# Patient Record
Sex: Male | Born: 2000 | Race: Black or African American | Hispanic: No | Marital: Married | Smoking: Never smoker
Health system: Southern US, Community
[De-identification: ages and names within clinical notes are randomized; demographics above are authoritative.]

## PROBLEM LIST (undated history)

## (undated) DIAGNOSIS — J45909 Unspecified asthma, uncomplicated: Secondary | ICD-10-CM

## (undated) HISTORY — PX: SURGERY SCROTAL / TESTICULAR: SUR1316

---
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
## 2021-04-27 ENCOUNTER — Encounter (HOSPITAL_COMMUNITY): Payer: Self-pay | Admitting: Emergency Medicine

## 2021-04-27 ENCOUNTER — Emergency Department (HOSPITAL_COMMUNITY)
Admission: EM | Admit: 2021-04-27 | Discharge: 2021-04-27 | Disposition: A | Discharge status: Home / Self Care | Attending: Emergency Medicine | Admitting: Emergency Medicine

## 2021-04-27 ENCOUNTER — Other Ambulatory Visit: Payer: Self-pay

## 2021-04-27 ENCOUNTER — Emergency Department (HOSPITAL_COMMUNITY)

## 2021-04-27 DIAGNOSIS — J029 Acute pharyngitis, unspecified: Secondary | ICD-10-CM | POA: Insufficient documentation

## 2021-04-27 DIAGNOSIS — R112 Nausea with vomiting, unspecified: Secondary | ICD-10-CM | POA: Insufficient documentation

## 2021-04-27 DIAGNOSIS — Z20822 Contact with and (suspected) exposure to covid-19: Secondary | ICD-10-CM | POA: Insufficient documentation

## 2021-04-27 DIAGNOSIS — K921 Melena: Secondary | ICD-10-CM | POA: Diagnosis not present

## 2021-04-27 DIAGNOSIS — J45909 Unspecified asthma, uncomplicated: Secondary | ICD-10-CM | POA: Diagnosis not present

## 2021-04-27 DIAGNOSIS — R109 Unspecified abdominal pain: Secondary | ICD-10-CM | POA: Diagnosis not present

## 2021-04-27 DIAGNOSIS — R000 Tachycardia, unspecified: Secondary | ICD-10-CM | POA: Diagnosis not present

## 2021-04-27 DIAGNOSIS — J111 Influenza due to unidentified influenza virus with other respiratory manifestations: Secondary | ICD-10-CM

## 2021-04-27 DIAGNOSIS — R5383 Other fatigue: Secondary | ICD-10-CM | POA: Diagnosis not present

## 2021-04-27 DIAGNOSIS — R059 Cough, unspecified: Secondary | ICD-10-CM | POA: Diagnosis not present

## 2021-04-27 LAB — RAPID HIV SCREEN (HIV 1/2 AB+AG)
HIV 1/2 Antibodies: NONREACTIVE
HIV-1 P24 Antigen - HIV24: NONREACTIVE

## 2021-04-27 LAB — COMPREHENSIVE METABOLIC PANEL
ALT: 57 U/L — ABNORMAL HIGH (ref 0–44)
AST: 75 U/L — ABNORMAL HIGH (ref 15–41)
Albumin: 3.9 g/dL (ref 3.5–5.0)
Alkaline Phosphatase: 87 U/L (ref 38–126)
Anion gap: 11 (ref 5–15)
BUN: 11 mg/dL (ref 6–20)
CO2: 22 mmol/L (ref 22–32)
Calcium: 8.9 mg/dL (ref 8.9–10.3)
Chloride: 103 mmol/L (ref 98–111)
Creatinine, Ser: 1.36 mg/dL — ABNORMAL HIGH (ref 0.61–1.24)
GFR, Estimated: >60 mL/min (ref >60)
Glucose, Bld: 114 mg/dL — ABNORMAL HIGH (ref 70–99)
Potassium: 4.3 mmol/L (ref 3.5–5.1)
Sodium: 136 mmol/L (ref 135–145)
Total Bilirubin: 0.9 mg/dL (ref 0.3–1.2)
Total Protein: 6.6 g/dL (ref 6.5–8.1)

## 2021-04-27 LAB — CBC
HCT: 49.2 % (ref 39.0–52.0)
Hemoglobin: 15.8 g/dL (ref 13.0–17.0)
MCH: 28.8 pg (ref 26.0–34.0)
MCHC: 32.1 g/dL (ref 30.0–36.0)
MCV: 89.8 fL (ref 80.0–100.0)
Platelets: 252 10*3/uL (ref 150–400)
RBC: 5.48 MIL/uL (ref 4.22–5.81)
RDW: 13.1 % (ref 11.5–15.5)
WBC: 7.7 10*3/uL (ref 4.0–10.5)
nRBC: 0 % (ref 0.0–0.2)

## 2021-04-27 LAB — TYPE AND SCREEN
ABO/RH(D): B POS
Antibody Screen: NEGATIVE

## 2021-04-27 LAB — RESP PANEL BY RT-PCR (FLU A&B, COVID) ARPGX2
Influenza A by PCR: NEGATIVE
Influenza B by PCR: NEGATIVE
SARS Coronavirus 2 by RT PCR: NEGATIVE

## 2021-04-27 LAB — TROPONIN I (HIGH SENSITIVITY): Troponin I (High Sensitivity): 4 ng/L (ref <18)

## 2021-04-27 LAB — LACTIC ACID, PLASMA
Lactic Acid, Venous: 3.2 mmol/L (ref 0.5–1.9)
Lactic Acid, Venous: 2.4 mmol/L (ref 0.5–1.9)

## 2021-04-27 LAB — MONONUCLEOSIS SCREEN: Mono Screen: NEGATIVE

## 2021-04-27 MED ORDER — ACETAMINOPHEN 325 MG PO TABS
650.0000 mg | ORAL_TABLET | Freq: Once | ORAL | Status: AC | PRN
  Administered 2021-04-27: 650 mg via ORAL
  Filled 2021-04-27: qty 2

## 2021-04-27 MED ORDER — SODIUM CHLORIDE 0.9 % IV BOLUS
1000.0000 mL | Freq: Once | INTRAVENOUS | Status: AC
  Administered 2021-04-27: 1000 mL via INTRAVENOUS

## 2021-04-27 MED ORDER — ONDANSETRON 4 MG PO TBDP: 4.0000 mg | ORAL_TABLET | Freq: Three times a day (TID) | ORAL | 0 refills | Status: AC | PRN

## 2021-04-27 NOTE — Discharge Instructions (Addendum)
Please read and follow all provided instructions.  Your diagnoses today include:  1. Influenza-like illness     Tests performed today include: Complete blood cell count: was normal Complete metabolic panel: Slightly high liver function tests, otherwise normal COVID, flu, HIV, mono tests: All were negative Chest x-ray: No signs of pneumonia or other problems Blood test for stress on the heart: Was normal, no sign of heart inflammation Vital signs. See below for your results today.   Medications prescribed:  Take Tylenol or ibuprofen as directed on the packaging  Zofran (ondansetron) - for nausea and vomiting  Take any prescribed medications only as directed.  Home care instructions:  Follow any educational materials contained in this packet. Please continue drinking plenty of fluids. Use over-the-counter cold and flu medications as needed as directed on packaging for symptom relief. You may also use ibuprofen or tylenol as directed on packaging for pain or fever.   BE VERY CAREFUL not to take multiple medicines containing Tylenol (also called acetaminophen). Doing so can lead to an overdose which can damage your liver and cause liver failure and possibly death.   Follow-up instructions: Please follow-up with your primary care provider in the next 3 days for further evaluation of your symptoms.   Return instructions:  Please return to the Emergency Department if you experience worsening symptoms. Please return if you have a high fever greater than 101 degrees not controlled with over-the-counter medications, persistent vomiting and cannot keep down fluids, or worsening trouble breathing. Please return if you have any other emergent concerns.  Additional Information:  Your vital signs today were: BP (!) 108/58   Pulse 87   Temp 99.7 F (37.6 C) (Oral)   Resp 19   Ht 5\' 9"  (1.753 m)   Wt 90.7 kg   SpO2 98%   BMI 29.53 kg/m  If your blood pressure (BP) was elevated above  135/85 this visit, please have this repeated by your doctor within one month.

## 2021-04-27 NOTE — ED Notes (Signed)
Pt tolerated fluid challenge well. RN notified.

## 2021-04-27 NOTE — ED Notes (Signed)
Lactic 3.2.  Lauren, PA notified.

## 2021-04-27 NOTE — ED Triage Notes (Addendum)
Pt reports bright red blood in bowel movements x 2-3 days.  Reports 1 week of body aches, fever, chills, vomiting, non-productive cough, sneezing, and lower back pain.

## 2021-04-27 NOTE — ED Provider Notes (Signed)
Emergency Medicine Provider Triage Evaluation Note  Roberto Gordon , a 20 y.o. male  was evaluated in triage.  Pt complains of multiple medical complaints.  He states that for around 1 week he has had nonproductive cough, sneezing, body aches.  He states that this is progressed to last night feeling like he was having chills and fever.  He endorses 2 episodes of vomiting this morning.  He also endorses bright red blood in his bowel movements for the past 2 to 3 days.  He states this is only when he is wiping that he notices blood.  There is not frank blood in his stool or in the toilet bowl.  Denies abdominal pain.  Denies any sick contacts. Review of Systems  Positive: See above Negative:   Physical Exam  BP (!) 143/67 (BP Location: Left Arm)   Pulse (!) 140   Temp (!) 101.1 F (38.4 C) (Oral)   Resp 20   Ht 5\' 9"  (1.753 m)   Wt 90.7 kg   SpO2 99%   BMI 29.53 kg/m  Gen:   Awake, no distress   Resp:  Normal effort, nonproductive cough, lungs clear to auscultation bilaterally MSK:   Moves extremities without difficulty  Other:  S1/S2, tachycardia, no appreciable murmur.  Pulses 2+ bilaterally.  Abdomen is soft and nontender.  Medical Decision Making  Medically screening exam initiated at 7:56 AM.  Appropriate orders placed.  DANIIL LABARGE was informed that the remainder of the evaluation will be completed by another provider, this initial triage assessment does not replace that evaluation, and the importance of remaining in the ED until their evaluation is complete.     Candi Leash, PA-C 04/27/21 0758    14/[birth year], DO 04/27/21 [phone]

## 2021-04-27 NOTE — ED Provider Notes (Signed)
[facility] EMERGENCY DEPARTMENT Provider Note   CSN: [id number] Arrival date & time: 04/27/21  1245     History Chief Complaint  Patient presents with   Vomiting   Cough   Chills   Blood In Stools    Roberto Gordon is a 20 y.o. male.  Patient with history of asthma presents to the emergency department for worsening flulike illness.  Patient states that he is currently stationed in [place], [place].  Several weeks ago he had body aches and flulike symptoms.  He states that he was tested for COVID twice, both were negative.  He had improvement until about a week ago.  He states that he has been feeling fatigued and has had a sore throat.  He did not think much about this.  However starting last night he began to feel worse with body aches, chills and fever.  Temperature 101.1 F on arrival.  He also had nausea and vomiting, nonproductive cough.  He has had some vague abdominal discomfort, nonfocal.  He has past several bowel movements with bright red blood.  No chest pain or shortness of breath.  No lower extremity swelling.  Denies any other recent travel except from [place], including international travel.  No tick bites.  The onset of this condition was acute. The course is constant. Aggravating factors: none. Alleviating factors: none.        Past Medical History:  Diagnosis Date   Asthma     There are no problems to display for this patient.   Past Surgical History:  Procedure Laterality Date   SURGERY SCROTAL / TESTICULAR         No family history on file.  Social History   Tobacco Use   Smoking status: Never   Smokeless tobacco: Never  Vaping Use   Vaping Use: Every day  Substance Use Topics   Alcohol use: No   Drug use: Not Currently    Home Medications Prior to Admission medications   Medication Sig Start Date End Date Taking? Authorizing Provider  albuterol (PROVENTIL HFA;VENTOLIN HFA) 108 (90 BASE) MCG/ACT inhaler  Inhale 2 puffs into the lungs every 6 (six) hours as needed for wheezing.    [provider]  ondansetron (ZOFRAN ODT) 4 MG disintegrating tablet 4mg  ODT q4 hours prn nausea/vomit 04/17/13   04/19/13, MD    Allergies    Patient has no known allergies.  Review of Systems   Review of Systems  Constitutional:  Positive for chills, fatigue and fever.  HENT:  Positive for sore throat. Negative for congestion and rhinorrhea.   Eyes:  Negative for redness.  Respiratory:  Positive for cough. Negative for shortness of breath.   Cardiovascular:  Negative for chest pain.  Gastrointestinal:  Positive for abdominal pain, blood in stool, nausea and vomiting. Negative for diarrhea.  Genitourinary:  Negative for dysuria and hematuria.  Musculoskeletal:  Positive for myalgias.  Skin:  Negative for rash.  Neurological:  Negative for headaches.   Physical Exam Updated Vital Signs BP (!) 98/59   Pulse (!) 120   Temp (!) 101.1 F (38.4 C) (Oral)   Resp 20   Ht 5\' 9"  (1.753 m)   Wt 90.7 kg   SpO2 97%   BMI 29.53 kg/m   Physical Exam Vitals and nursing note reviewed.  Constitutional:      General: He is not in acute distress.    Appearance: He is well-developed.  HENT:  Head: Normocephalic and atraumatic.     Right Ear: Tympanic membrane, ear canal and external ear normal.     Left Ear: Tympanic membrane, ear canal and external ear normal.     Nose: No congestion or rhinorrhea.     Mouth/Throat:     Pharynx: Oropharynx is clear. No oropharyngeal exudate or posterior oropharyngeal erythema.  Eyes:     General:        Right eye: No discharge.        Left eye: No discharge.     Conjunctiva/sclera: Conjunctivae normal.  Cardiovascular:     Rate and Rhythm: Regular rhythm. Tachycardia present.     Heart sounds: Normal heart sounds.  Pulmonary:     Effort: Pulmonary effort is normal.     Breath sounds: Normal breath sounds.  Abdominal:     Palpations: Abdomen is soft.      Tenderness: There is abdominal tenderness. There is no guarding or rebound.     Comments: Mild, nonfocal tenderness.  No wincing.  No peritoneal signs.  Musculoskeletal:        General: No swelling or tenderness.     Cervical back: Normal range of motion and neck supple.  Skin:    General: Skin is warm and dry.  Neurological:     General: No focal deficit present.     Mental Status: He is alert.    ED Results / Procedures / Treatments   Labs (all labs ordered are listed, but only abnormal results are displayed) Labs Reviewed  COMPREHENSIVE METABOLIC PANEL - Abnormal; Notable for the following components:      Result Value   Glucose, Bld 114 (*)    Creatinine, Ser 1.36 (*)    AST 75 (*)    ALT 57 (*)    All other components within normal limits  LACTIC ACID, PLASMA - Abnormal; Notable for the following components:   Lactic Acid, Venous 3.2 (*)    All other components within normal limits  LACTIC ACID, PLASMA - Abnormal; Notable for the following components:   Lactic Acid, Venous 2.4 (*)    All other components within normal limits  RESP PANEL BY RT-PCR (FLU A&B, COVID) ARPGX2  CULTURE, BLOOD (ROUTINE X 2)  CULTURE, BLOOD (ROUTINE X 2)  CBC  MONONUCLEOSIS SCREEN  RAPID HIV SCREEN (HIV 1/2 AB+AG)  TYPE AND SCREEN  TROPONIN I (HIGH SENSITIVITY)    EKG EKG Interpretation  Date/Time:  Saturday April 27 2021 07:47:28 EST Ventricular Rate:  152 PR Interval:  136 QRS Duration: 76 QT Interval:  250 QTC Calculation: 397 R Axis:   83 Text Interpretation: Sinus tachycardia Otherwise normal ECG No previous tracing Confirmed by Blanchie Dessert [phone]) on 04/27/2021 11:51:21 AM  Radiology DG Chest 2 View  Result Date: 04/27/2021 CLINICAL DATA:  Fever, cough. EXAM: CHEST - 2 VIEW COMPARISON:  May 01, 2008. FINDINGS: The heart size and mediastinal contours are within normal limits. Both lungs are clear. The visualized skeletal structures are unremarkable. IMPRESSION:  No active cardiopulmonary disease. Electronically Signed   By: Marijo Conception M.D.   On: 04/27/2021 08:35    Procedures Procedures   Medications Ordered in ED Medications  acetaminophen (TYLENOL) tablet 650 mg (650 mg Oral Given 04/27/21 0751)  sodium chloride 0.9 % bolus 1,000 mL (0 mLs Intravenous Stopped 04/27/21 1145)  sodium chloride 0.9 % bolus 1,000 mL (0 mLs Intravenous Stopped 04/27/21 1145)    ED Course  I have reviewed the triage vital signs  and the nursing notes.  Pertinent labs & imaging results that were available during my care of the patient were reviewed by me and considered in my medical decision making (see chart for details).  Patient seen and examined.  Reviewed labs ordered in triage.  Plan discussed with patient.   Labs: Added troponin to evaluate any possibility of postviral myocarditis, Monospot, HIV.  We will send blood cultures given elevated lactate at 3.2.  Imaging: Chest x-ray was clear.  Medications/Fluids: IV fluid bolus.  Vital signs reviewed and are as follows: BP (!) 98/59   Pulse (!) 120   Temp (!) 101.1 F (38.4 C) (Oral)   Resp 20   Ht 5\' 9"  (1.753 m)   Wt 90.7 kg   SpO2 97%   BMI 29.53 kg/m   Initial impression: Patient with flulike illness, viral testing here is negative.  Will expand work-up, give IV fluids and recheck lactate.  12:52 PM Lactate 3.2 >> 2.4.   Patient updated on results.  On reexam, abdomen is soft and nontender.  When I palpate and ask how it feels, he states "not bad".  Will p.o. challenge.  Heart rate is improved.  No respiratory distress.  He is resting in the room and appears comfortable.  Plan: Discharge home. Encouraged to rest and drink plenty of fluids.  Patient told to return to ED or see their primary doctor if their symptoms worsen, high fever not controlled with tylenol, persistent vomiting, development of worsening or severe abdominal pain, if they feel they are dehydrated, or if they have any other  concerns.  Patient verbalized understanding and agreed with plan.      MDM Rules/Calculators/A&P                           Patient with influenza-like illness, however viral panel negative.  Patient with significant tachycardia, fever, elevated lactate on arrival.  He had good response to IV fluids and Tylenol.  Due to initial concern for sepsis, and elevated lactate, and negative flu/COVID, additional labs were ordered.  This includes mono, troponin to evaluate for postviral myocarditis, HIV.  Fortunately these were negative.  Patient tolerating fluids after treatment.  No indications for admission at this time.  Patient will monitor symptoms carefully and return if worsening.  He does report blood in stool however abdomen is soft, mildly tender nontender, and no focal tenderness.  Do not feel that patient requires CT imaging today given reassuring abdominal exam.  Low concern for colitis, perforated ulcer, diverticulitis.   Final diagnoses:  Influenza-like illness    Rx / DC Orders ED Discharge Orders          Ordered    ondansetron (ZOFRAN-ODT) 4 MG disintegrating tablet  Every 8 hours PRN        04/27/21 1359             Carlisle Cater, PA-C 04/27/21 1446    Blanchie Dessert, MD 08/10/21 2043

## 2021-05-02 LAB — CULTURE, BLOOD (ROUTINE X 2)
Culture: NO GROWTH
Culture: NO GROWTH
Special Requests: ADEQUATE

## 2023-03-16 IMAGING — DX DG CHEST 2V
2 series · 2 of 2 positions shown · non-contrast
Comparison: May 01, 2008.

CLINICAL DATA: Fever, cough.

EXAM:
CHEST - 2 VIEW

[chest pa]
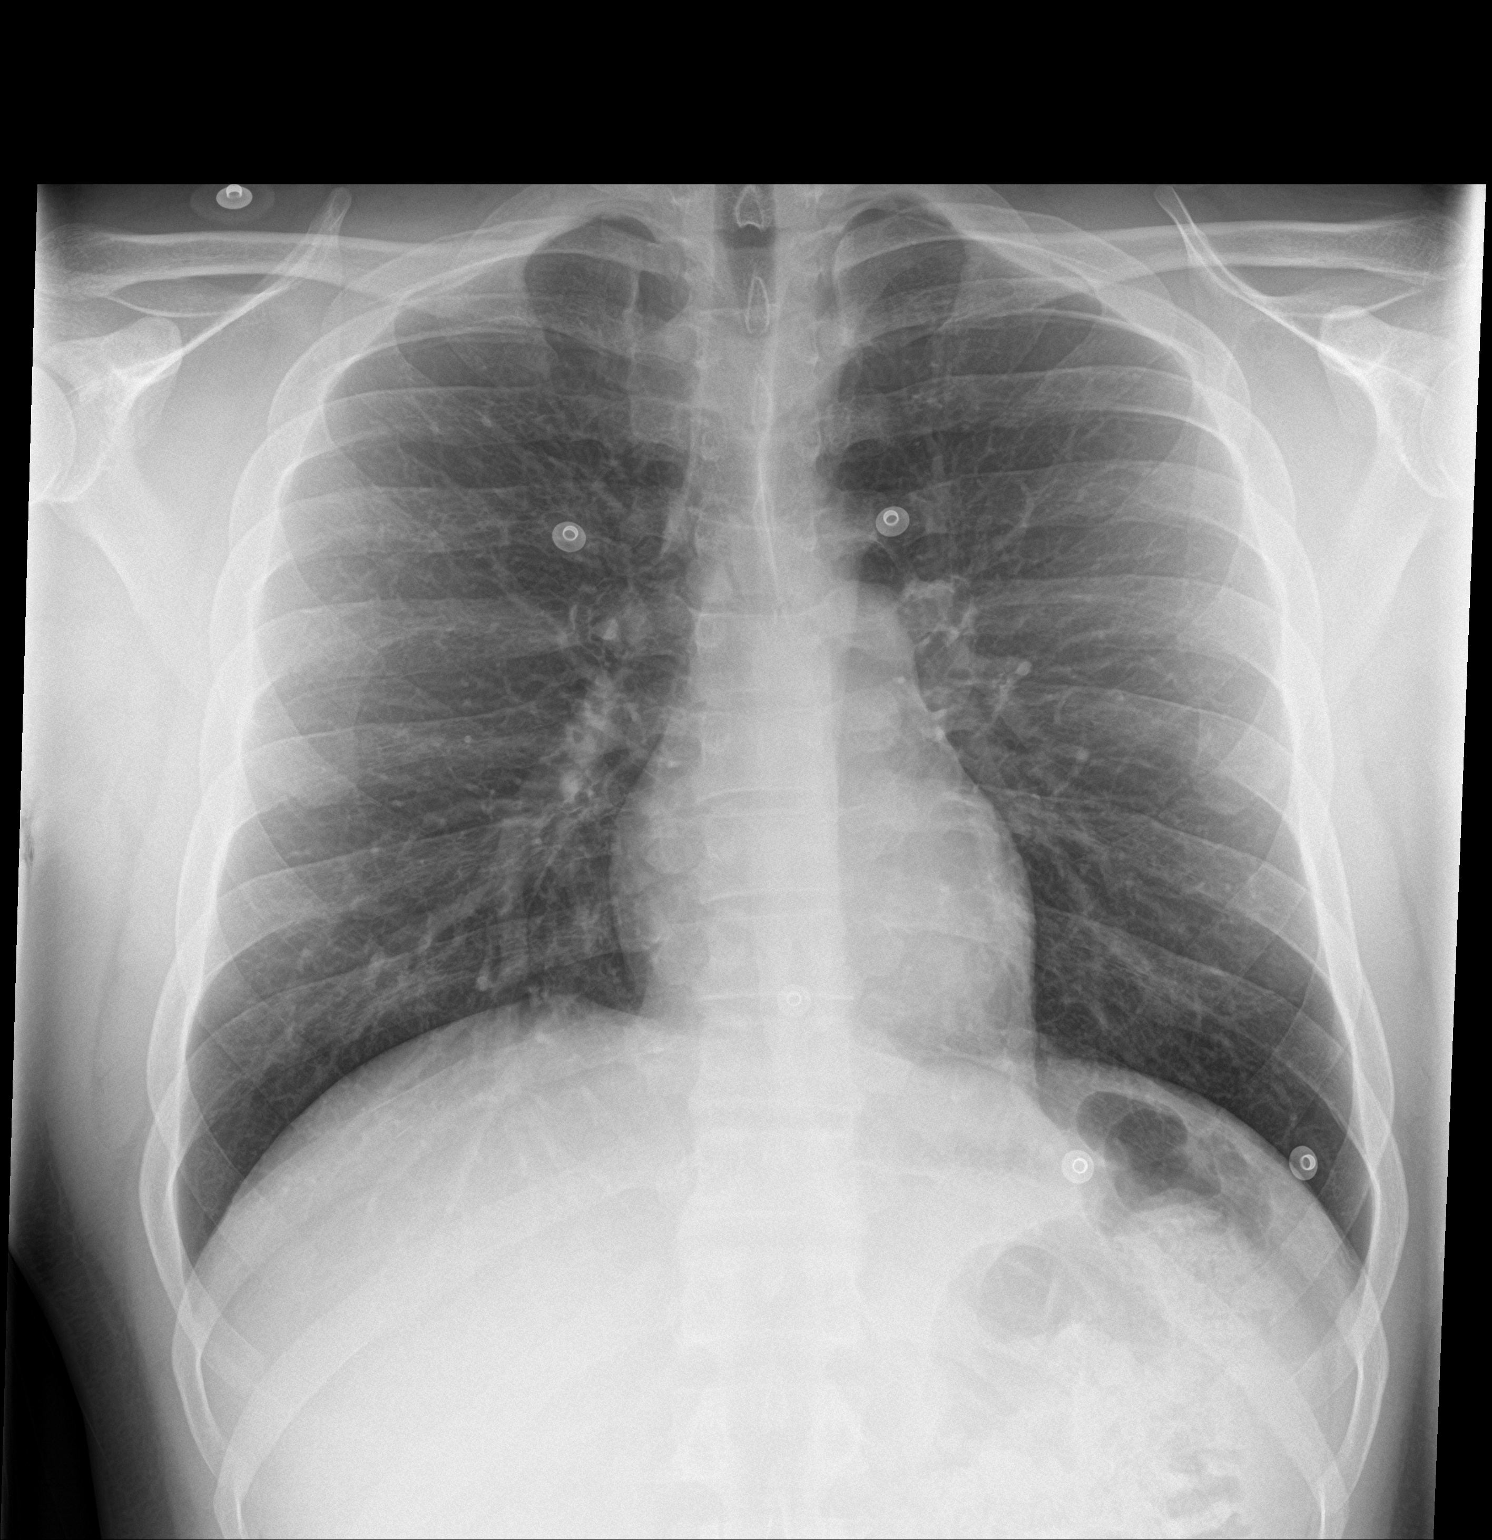

[chest lat]
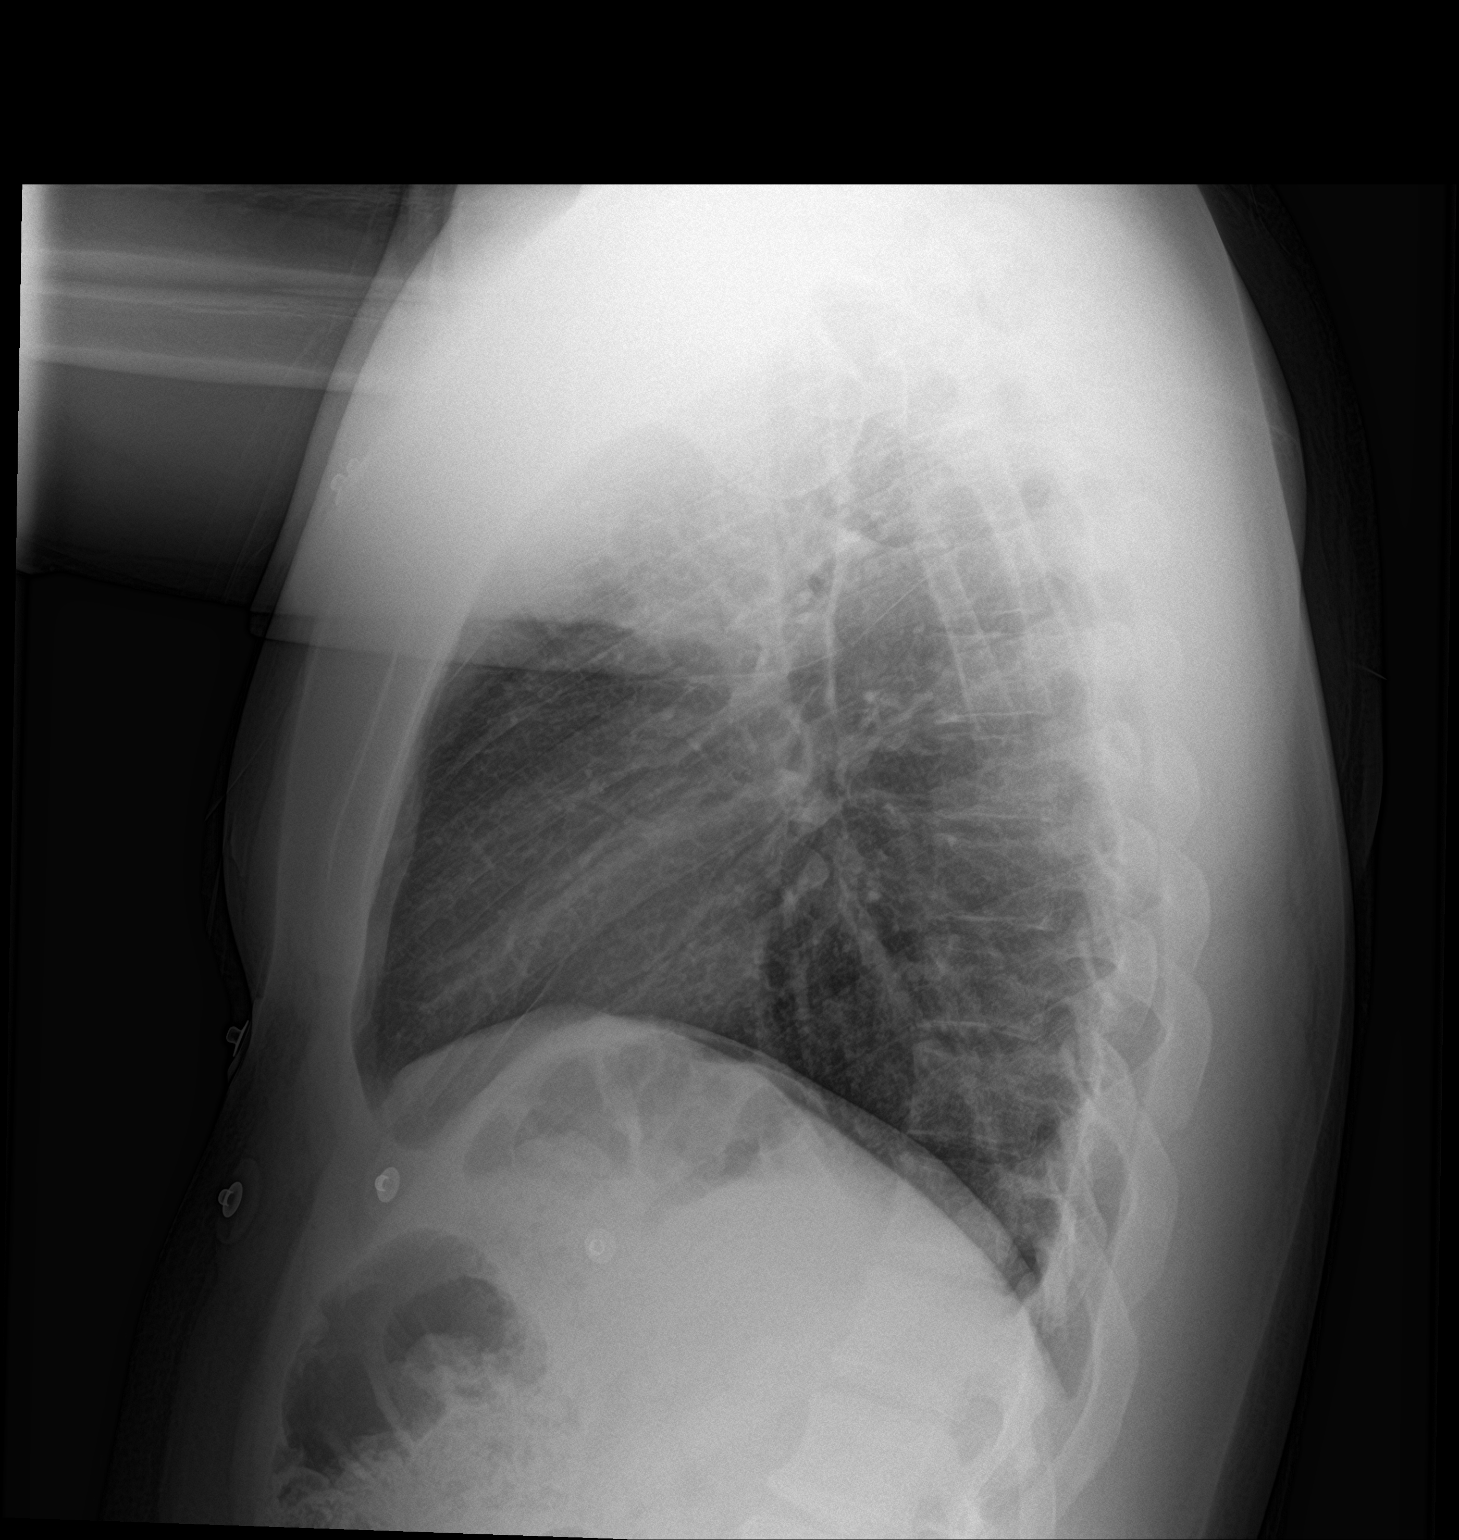

[2 of 2 positions shown; findings below may reference images not displayed]

FINDINGS: The heart size and mediastinal contours are within normal limits.
Both lungs are clear. The visualized skeletal structures are
unremarkable.
IMPRESSION: No active cardiopulmonary disease.
